# Patient Record
Sex: Male | Born: 1985 | Hispanic: No | Marital: Single | Smoking: Current every day smoker
Health system: Southern US, Community
[De-identification: ages and names within clinical notes are randomized; demographics above are authoritative.]

---

## 2015-02-09 ENCOUNTER — Encounter (HOSPITAL_COMMUNITY): Payer: Self-pay | Admitting: Emergency Medicine

## 2015-02-09 ENCOUNTER — Emergency Department (INDEPENDENT_AMBULATORY_CARE_PROVIDER_SITE_OTHER)
Admission: EM | Admit: 2015-02-09 | Discharge: 2015-02-09 | Disposition: A | Discharge status: Home / Self Care | Payer: PRIVATE HEALTH INSURANCE | Source: Home / Self Care | Attending: Family Medicine | Admitting: Family Medicine

## 2015-02-09 DIAGNOSIS — M545 Low back pain, unspecified: Secondary | ICD-10-CM

## 2015-02-09 DIAGNOSIS — S76311A Strain of muscle, fascia and tendon of the posterior muscle group at thigh level, right thigh, initial encounter: Secondary | ICD-10-CM | POA: Diagnosis not present

## 2015-02-09 DIAGNOSIS — M791 Myalgia, unspecified site: Secondary | ICD-10-CM

## 2015-02-09 DIAGNOSIS — T148 Other injury of unspecified body region: Secondary | ICD-10-CM

## 2015-02-09 DIAGNOSIS — T148XXA Other injury of unspecified body region, initial encounter: Secondary | ICD-10-CM

## 2015-02-09 MED ORDER — DICLOFENAC POTASSIUM 50 MG PO TABS: 50.0000 mg | ORAL_TABLET | Freq: Three times a day (TID) | ORAL | Status: AC

## 2015-02-09 NOTE — Discharge Instructions (Signed)
Back Pain, Adult Back pain is very common. The pain often gets better over time. The cause of back pain is usually not dangerous. Most people can learn to manage their back pain on their own.  HOME CARE  Watch your back pain for any changes. The following actions may help to lessen any pain you are feeling:  Stay active. Start with short walks on flat ground if you can. Try to walk farther each day.  Exercise regularly as told by your doctor. Exercise helps your back heal faster. It also helps avoid future injury by keeping your muscles strong and flexible.  Do not sit, drive, or stand in one place for more than 30 minutes.  Do not stay in bed. Resting more than 1-2 days can slow down your recovery.  Be careful when you bend or lift an object. Use good form when lifting:  Bend at your knees.  Keep the object close to your body.  Do not twist.  Sleep on a firm mattress. Lie on your side, and bend your knees. If you lie on your back, put a pillow under your knees.  Take medicines only as told by your doctor.  Put ice on the injured area.  Put ice in a plastic bag.  Place a towel between your skin and the bag.  Leave the ice on for 20 minutes, 2-3 times a day for the first 2-3 days. After that, you can switch between ice and heat packs.  Avoid feeling anxious or stressed. Find good ways to deal with stress, such as exercise.  Maintain a healthy weight. Extra weight puts stress on your back. GET HELP IF:   You have pain that does not go away with rest or medicine.  You have worsening pain that goes down into your legs or buttocks.  You have pain that does not get better in one week.  You have pain at night.  You lose weight.  You have a fever or chills. GET HELP RIGHT AWAY IF:   You cannot control when you poop (bowel movement) or pee (urinate).  Your arms or legs feel weak.  Your arms or legs lose feeling (numbness).  You feel sick to your stomach (nauseous) or  throw up (vomit).  You have belly (abdominal) pain.  You feel like you may pass out (faint).   This information is not intended to replace advice given to you by your health care provider. Make sure you discuss any questions you have with your health care provider.   Document Released: 08/24/2007 Document Revised: 03/28/2014 Document Reviewed: 07/09/2013 Elsevier Interactive Patient Education 2016 [place] Injury Prevention Back injuries can be very painful. They can also be difficult to heal. After having one back injury, you are more likely to injure your back again. It is important to learn how to avoid injuring or re-injuring your back. The following tips can help you to prevent a back injury. WHAT SHOULD I KNOW ABOUT PHYSICAL FITNESS?  Exercise for 30 minutes per day on most days of the week or as told by your doctor. Make sure to:  Do aerobic exercises, such as walking, jogging, biking, or swimming.  Do exercises that increase balance and strength, such as tai chi and yoga.  Do stretching exercises. This helps with flexibility.  Try to develop strong belly (abdominal) muscles. Your belly muscles help to support your back.  Stay at a healthy weight. This helps to decrease your risk of a back injury.  WHAT SHOULD I KNOW ABOUT MY DIET?  Talk with your doctor about your overall diet. Take supplements and vitamins only as told by your doctor.  Talk with your doctor about how much calcium and vitamin D you need each day. These nutrients help to prevent weakening of the bones (osteoporosis).  Include good sources of calcium in your diet, such as:  Dairy products.  Green leafy vegetables.  Products that have had calcium added to them (fortified).  Include good sources of vitamin D in your diet, such as:  Milk.  Foods that have had vitamin D added to them. WHAT SHOULD I KNOW ABOUT MY POSTURE?  Sit up straight and stand up straight. Avoid leaning forward when you  sit or hunching over when you stand.  Choose chairs that have good low-back (lumbar) support.  If you work at a desk, sit close to it so you do not need to lean over. Keep your chin tucked in. Keep your neck drawn back. Keep your elbows bent so your arms look like the letter "L" (right angle).  Sit high and close to the steering wheel when you drive. Add a low-back support to your car seat, if needed.  Avoid sitting or standing in one position for very long. Take breaks to get up, stretch, and walk around at least one time every hour. Take breaks every hour if you are driving for long periods of time.  Sleep on your side with your knees slightly bent, or sleep on your back with a pillow under your knees. Do not lie on the front of your body to sleep. WHAT SHOULD I KNOW ABOUT LIFTING, TWISTING, AND REACHING Lifting and Heavy Lifting  Avoid heavy lifting, especially lifting over and over again. If you must do heavy lifting:  Stretch before lifting.  Work slowly.  Rest between lifts.  Use a tool such as a cart or a dolly to move objects if one is available.  Make several small trips instead of carrying one heavy load.  Ask for help when you need it, especially when moving big objects.  Follow these steps when lifting:  Stand with your feet shoulder-width apart.  Get as close to the object as you can. Do not pick up a heavy object that is far from your body.  Use handles or lifting straps if they are available.  Bend at your knees. Squat down, but keep your heels off the floor.  Keep your shoulders back. Keep your chin tucked in. Keep your back straight.  Lift the object slowly while you tighten the muscles in your legs, belly, and butt. Keep the object as close to the center of your body as possible.  Follow these steps when putting down a heavy load:  Stand with your feet shoulder-width apart.  Lower the object slowly while you tighten the muscles in your legs, belly, and  butt. Keep the object as close to the center of your body as possible.  Keep your shoulders back. Keep your chin tucked in. Keep your back straight.  Bend at your knees. Squat down, but keep your heels off the floor.  Use handles or lifting straps if they are available. Twisting and Reaching  Avoid lifting heavy objects above your waist.  Do not twist at your waist while you are lifting or carrying a load. If you need to turn, move your feet.  Do not bend over without bending at your knees.  Avoid reaching over your head, across a table,  or for an object on a high surface.  WHAT ARE SOME OTHER TIPS?  Avoid wet floors and icy ground. Keep sidewalks clear of ice to prevent falls.   Do not sleep on a mattress that is too soft or too hard.   Keep items that you use often within easy reach.   Put heavier objects on shelves at waist level, and put lighter objects on lower or higher shelves.  Find ways to lower your stress, such as:  Exercise.  Massage.  Relaxation techniques.  Talk with your doctor if you feel anxious or depressed. These conditions can make back pain worse.  Wear flat heel shoes with cushioned soles.  Avoid making quick (sudden) movements.  Use both shoulder straps when carrying a backpack.  Do not use any tobacco products, including cigarettes, chewing tobacco, or electronic cigarettes. If you need help quitting, ask your doctor.   This information is not intended to replace advice given to you by your health care provider. Make sure you discuss any questions you have with your health care provider.   Document Released: 08/24/2007 Document Revised: 07/22/2014 Document Reviewed: 03/11/2014 Elsevier Interactive Patient Education 2016 [place].  Back Exercises If you have pain in your back, do these exercises 2-3 times each day or as told by your doctor. When the pain goes away, do the exercises once each day, but repeat the steps more times for  each exercise (do more repetitions). If you do not have pain in your back, do these exercises once each day or as told by your doctor. EXERCISES Single Knee to Chest Do these steps 3-5 times in a row for each leg:  Lie on your back on a firm bed or the floor with your legs stretched out.  Bring one knee to your chest.  Hold your knee to your chest by grabbing your knee or thigh.  Pull on your knee until you feel a gentle stretch in your lower back.  Keep doing the stretch for 10-30 seconds.  Slowly let go of your leg and straighten it. Pelvic Tilt Do these steps 5-10 times in a row:  Lie on your back on a firm bed or the floor with your legs stretched out.  Bend your knees so they point up to the ceiling. Your feet should be flat on the floor.  Tighten your lower belly (abdomen) muscles to press your lower back against the floor. This will make your tailbone point up to the ceiling instead of pointing down to your feet or the floor.  Stay in this position for 5-10 seconds while you gently tighten your muscles and breathe evenly. Cat-Cow Do these steps until your lower back bends more easily:  Get on your hands and knees on a firm surface. Keep your hands under your shoulders, and keep your knees under your hips. You may put padding under your knees.  Let your head hang down, and make your tailbone point down to the floor so your lower back is round like the back of a cat.  Stay in this position for 5 seconds.  Slowly lift your head and make your tailbone point up to the ceiling so your back hangs low (sags) like the back of a cow.  Stay in this position for 5 seconds. Press-Ups Do these steps 5-10 times in a row:  Lie on your belly (face-down) on the floor.  Place your hands near your head, about shoulder-width apart.  While you keep your back relaxed  and keep your hips on the floor, slowly straighten your arms to raise the top half of your body and lift your shoulders.  Do not use your back muscles. To make yourself more comfortable, you may change where you place your hands.  Stay in this position for 5 seconds.  Slowly return to lying flat on the floor. Bridges Do these steps 10 times in a row:  Lie on your back on a firm surface.  Bend your knees so they point up to the ceiling. Your feet should be flat on the floor.  Tighten your butt muscles and lift your butt off of the floor until your waist is almost as high as your knees. If you do not feel the muscles working in your butt and the back of your thighs, slide your feet 1-2 inches farther away from your butt.  Stay in this position for 3-5 seconds.  Slowly lower your butt to the floor, and let your butt muscles relax. If this exercise is too easy, try doing it with your arms crossed over your chest. Belly Crunches Do these steps 5-10 times in a row:  Lie on your back on a firm bed or the floor with your legs stretched out.  Bend your knees so they point up to the ceiling. Your feet should be flat on the floor.  Cross your arms over your chest.  Tip your chin a little bit toward your chest but do not bend your neck.  Tighten your belly muscles and slowly raise your chest just enough to lift your shoulder blades a tiny bit off of the floor.  Slowly lower your chest and your head to the floor. Back Lifts Do these steps 5-10 times in a row:  Lie on your belly (face-down) with your arms at your sides, and rest your forehead on the floor.  Tighten the muscles in your legs and your butt.  Slowly lift your chest off of the floor while you keep your hips on the floor. Keep the back of your head in line with the curve in your back. Look at the floor while you do this.  Stay in this position for 3-5 seconds.  Slowly lower your chest and your face to the floor. GET HELP IF:  Your back pain gets a lot worse when you do an exercise.  Your back pain does not lessen 2 hours after you  exercise. If you have any of these problems, stop doing the exercises. Do not do them again unless your doctor says it is okay. GET HELP RIGHT AWAY IF:  You have sudden, very bad back pain. If this happens, stop doing the exercises. Do not do them again unless your doctor says it is okay.   This information is not intended to replace advice given to you by your health care provider. Make sure you discuss any questions you have with your health care provider.   Document Released: 04/09/2010 Document Revised: 11/26/2014 Document Reviewed: 05/01/2014 Elsevier Interactive Patient Education 2016 [place].  Hamstring Strain With Rehab The hamstring muscle and tendons are vulnerable to muscle or tendon tear (strain). Hamstring tears cause pain and inflammation in the backside of the thigh, where the hamstring muscles are located. The hamstring is comprised of three muscles that are responsible for straightening the hip, bending the knee, and stabilizing the knee. These muscles are important for walking, running, and jumping. Hamstring strain is the most common injury of the thigh. Hamstring strains are classified as  grade 1 or 2 strains. Grade 1 strains cause pain, but the tendon is not lengthened. Grade 2 strains include a lengthened ligament due to the ligament being stretched or partially ruptured. With grade 2 strains there is still function, although the function may be decreased.  SYMPTOMS   Pain, tenderness, swelling, warmth, or redness over the hamstring muscles, at the back of the thigh.  Pain that gets worse during and after intense activity.  A "pop" heard in the area, at the time of injury.  Muscle spasm in the hamstring muscles.  Pain or weakness with running, jumping, or bending the knee against resistance.  Crackling sound (crepitation) when the tendon is moved or touched.  Bruising (contusion) in the thigh within 48 hours of injury.  Loss of fullness of the muscle, or area  of muscle bulging in the case of a complete rupture. CAUSES  A muscle strain occurs when a force is placed on the muscle or tendon that is greater than it can withstand. Common causes of injury include:  Strain from overuse or sudden increase in the frequency, duration, or intensity of activity.  Single violent blow or force to the back of the knee or the hamstring area of the thigh. RISK INCREASES WITH:  Sports that require quick starts (sprinting, racquetball, tennis).  Sports that require jumping (basketball, volleyball).  Kicking sports and water skiing.  Contact sports (soccer, football).  Poor strength and flexibility.  Failure to warm up properly before activity.  Previous thigh, knee, or pelvis injury.  Poor exercise technique.  Poor posture. PREVENTION  Maintain physical fitness:  Strength, flexibility, and endurance.  Cardiovascular fitness.  Learn and use proper exercise technique and posture.  Wear proper fitted and padded protective equipment. PROGNOSIS  If treated properly, hamstring strains are usually curable in 2 to 6 weeks. RELATED COMPLICATIONS   Longer healing time, if not properly treated or if not given adequate time to heal.  Chronically inflamed tendon, causing persistent pain with activity that may progress to constant pain.  Recurring symptoms, if activity is resumed too soon.  Vulnerable to repeated injury (in up to 25% of cases). TREATMENT  Treatment first involves the use of ice and medication to help reduce pain and inflammation. It is also important to complete strengthening and stretching exercises, as well as modifying any activities that aggravate the symptoms. These exercises may be completed at home or with a therapist. Your caregiver may recommend the use of crutches to help reduce pain and discomfort, especially is the strain is severe enough to cause limping. If the tendon has pulled away from the bone, then surgery is necessary  to reattach it. MEDICATION   If pain medicine is needed, nonsteroidal anti-inflammatory medicines (aspirin and ibuprofen), or other minor pain relievers (acetaminophen), are often advised.  Do not take pain medicine for 7 days before surgery.  Prescription pain relievers may be given if your caregiver thinks they are needed. Use only as directed and only as much as you need.  Corticosteroid injections may be recommended. However, these injections should only be used for serious cases, as they can only be given a certain number of times.  Ointments applied to the skin may be beneficial. HEAT AND COLD  Cold treatment (icing) relieves pain and reduces inflammation. Cold treatment should be applied for 10 to 15 minutes every 2 to 3 hours, and immediately after activity that aggravates your symptoms. Use ice packs or an ice massage.  Heat treatment may be used  before performing the stretching and strengthening activities prescribed by your caregiver, physical therapist, or athletic trainer. Use a heat pack or a warm water soak. SEEK MEDICAL CARE IF:   Symptoms get worse or do not improve in 2 weeks, despite treatment.  New, unexplained symptoms develop. (Drugs used in treatment may produce side effects.) EXERCISES RANGE OF MOTION (ROM) AND STRETCHING EXERCISES - Hamstring Strain These exercises may help you when beginning to rehabilitate your injury. Your symptoms may go away with or without further involvement from your physician, physical therapist or athletic trainer. While completing these exercises, remember:   Restoring tissue flexibility helps normal motion to return to the joints. This allows healthier, less painful movement and activity.  An effective stretch should be held for at least 30 seconds.  A stretch should never be painful. You should only feel a gentle lengthening or release in the stretched tissue. STRETCH - Hamstrings, Standing  Stand or sit, and extend your right /  left leg, placing your foot on a chair or foot stool.  Keep a slight arch in your low back and your hips straight forward.  Lead with your chest, and lean forward at the waist until you feel a gentle stretch in the back of your right / left knee or thigh. (When done correctly, this exercise requires leaning only a small distance.)  Hold this position for __________ seconds. Repeat __________ times. Complete this stretch __________ times per day. STRETCH - Hamstrings, Supine   Lie on your back. Loop a belt or towel over the ball of your right / left foot.  Straighten your right / left knee and slowly pull on the belt to raise your leg. Do not allow the right / left knee to bend. Keep your opposite leg flat on the floor.  Raise the leg until you feel a gentle stretch behind your right / left knee or thigh. Hold this position for __________ seconds. Repeat __________ times. Complete this stretch __________ times per day.  STRETCH - Hamstrings, Doorway  Lie on your back with your right / left leg extended and resting on the wall, and the opposite leg flat on the ground through the door. Initially, position your bottom farther away from the wall.  Keep your right / left knee straight. If you feel a stretch behind your knee or thigh, hold this position for __________ seconds.  If you do not feel a stretch, scoot your bottom closer to the door and hold __________ seconds. Repeat __________ times. Complete this stretch __________ times per day.  STRETCH - Hamstrings/Adductors, V-Sit   Sit on the floor with your legs extended in a large "V," keeping your knees straight.  With your head and chest upright, bend at your waist reaching for your left foot to stretch your right thigh muscles.  You should feel a stretch in your right inner thigh. Hold for __________ seconds.  Return to the upright position to relax your leg muscles.  Continuing to keep your chest upright, bend straight forward at  your waist to stretch your hamstrings.  You should feel a stretch behind both of your thighs and knees. Hold for __________ seconds.  Return to the upright position to relax your leg muscles.  With your head and chest upright, bend at your waist reaching for your right foot to stretch your left thigh muscles.  You should feel a stretch in your left inner thigh. Hold for __________ seconds.  Return to the upright position to relax your  leg muscles. Repeat __________ times. Complete this exercise __________ times per day.  STRENGTHENING EXERCISES - Hamstring Strain These exercises may help you when beginning to rehabilitate your injury. They may resolve your symptoms with or without further involvement from your physician, physical therapist or athletic trainer. While completing these exercises, remember:   Muscles can gain both the endurance and the strength needed for everyday activities through controlled exercises.  Complete these exercises as instructed by your physician, physical therapist or athletic trainer. Increase the resistance and repetitions only as guided.  You may experience muscle soreness or fatigue, but the pain or discomfort you are trying to eliminate should never get worse during these exercises. If this pain does get worse, stop and make certain you are following the directions exactly. If the pain is still present after adjustments, discontinue the exercise until you can discuss the trouble with your clinician. STRENGTH - Hip Extensors, Straight Leg Raises   Lie on your stomach on a firm surface.  Tense the muscles in your buttocks to lift your right / left leg about 4 inches. If you cannot lift your leg this high without arching your back, place a pillow under your hips.  Keep your knee straight. Hold for __________ seconds.  Slowly lower your leg to the starting position and allow it to relax completely before starting the next repetition. Repeat __________ times.  Complete this exercise __________ times per day.  STRENGTH - Hamstring, Isometrics   Lie on your back on a firm surface.  Bend your right / left knee approximately __________ degrees.  Dig your heel into the surface, as if you are trying to pull it toward your buttocks. Tighten the muscles in the back of your thighs to "dig" as hard as you can, without increasing any pain.  Hold this position for __________ seconds.  Release the tension gradually and allow your muscles to completely relax for __________ seconds between each exercise. Repeat __________ times. Complete this exercise __________ times per day.  STRENGTH - Hamstring, Curls   Lay on your stomach with your legs extended. (If you lay on a bed, your feet may hang over the edge.)  Tighten the muscles in the back of your thigh to bend your right / left knee up to 90 degrees. Keep your hips flat on the bed or floor.  Hold this position for __________ seconds.  Slowly lower your leg back to the starting position. Repeat __________ times. Complete this exercise __________ times per day.  OPTIONAL ANKLE WEIGHTS: Begin with ____________________, but DO NOT exceed ____________________. Increase in 1 pound/0.5 kilogram increments.   This information is not intended to replace advice given to you by your health care provider. Make sure you discuss any questions you have with your health care provider.   Document Released: 03/07/2005 Document Revised: 03/28/2014 Document Reviewed: 06/19/2008 Elsevier Interactive Patient Education 2016 [place] therapy can help ease sore, stiff, injured, and tight muscles and joints. Heat relaxes your muscles, which may help ease your pain.  RISKS AND COMPLICATIONS If you have any of the following conditions, do not use heat therapy unless your health care provider has approved:  Poor circulation.  Healing wounds or scarred skin in the area being treated.  Diabetes, heart  disease, or high blood pressure.  Not being able to feel (numbness) the area being treated.  Unusual swelling of the area being treated.  Active infections.  Blood clots.  Cancer.  Inability to communicate pain.  This may include young children and people who have problems with their brain function (dementia).  Pregnancy. Heat therapy should only be used on old, pre-existing, or long-lasting (chronic) injuries. Do not use heat therapy on new injuries unless directed by your health care provider. HOW TO USE HEAT THERAPY There are several different kinds of heat therapy, including:  Moist heat pack.  Warm water bath.  Hot water bottle.  Electric heating pad.  Heated gel pack.  Heated wrap.  Electric heating pad. Use the heat therapy method suggested by your health care provider. Follow your health care provider's instructions on when and how to use heat therapy. GENERAL HEAT THERAPY RECOMMENDATIONS  Do not sleep while using heat therapy. Only use heat therapy while you are awake.  Your skin may turn pink while using heat therapy. Do not use heat therapy if your skin turns red.  Do not use heat therapy if you have new pain.  High heat or long exposure to heat can cause burns. Be careful when using heat therapy to avoid burning your skin.  Do not use heat therapy on areas of your skin that are already irritated, such as with a rash or sunburn. SEEK MEDICAL CARE IF:  You have blisters, redness, swelling, or numbness.  You have new pain.  Your pain is worse. MAKE SURE YOU:  Understand these instructions.  Will watch your condition.  Will get help right away if you are not doing well or get worse.   This information is not intended to replace advice given to you by your health care provider. Make sure you discuss any questions you have with your health care provider.   Document Released: 05/30/2011 Document Revised: 03/28/2014 Document Reviewed:  04/30/2013 Elsevier Interactive Patient Education 2016 [place].  Muscle Strain A muscle strain (pulled muscle) happens when a muscle is stretched beyond normal length. It happens when a sudden, violent force stretches your muscle too far. Usually, a few of the fibers in your muscle are torn. Muscle strain is common in athletes. Recovery usually takes 1-2 weeks. Complete healing takes 5-6 weeks.  HOME CARE   Follow the PRICE method of treatment to help your injury get better. Do this the first 2-3 days after the injury:  Protect. Protect the muscle to keep it from getting injured again.  Rest. Limit your activity and rest the injured body part.  Ice. Put ice in a plastic bag. Place a towel between your skin and the bag. Then, apply the ice and leave it on from 15-20 minutes each hour. After the third day, switch to moist heat packs.  Compression. Use a splint or elastic bandage on the injured area for comfort. Do not put it on too tightly.  Elevate. Keep the injured body part above the level of your heart.  Only take medicine as told by your doctor.  Warm up before doing exercise to prevent future muscle strains. GET HELP IF:   You have more pain or puffiness (swelling) in the injured area.  You feel numbness, tingling, or notice a loss of strength in the injured area. MAKE SURE YOU:   Understand these instructions.  Will watch your condition.  Will get help right away if you are not doing well or get worse.   This information is not intended to replace advice given to you by your health care provider. Make sure you discuss any questions you have with your health care provider.   Document Released: 12/15/2007 Document Revised:  12/26/2012 Document Reviewed: 10/04/2012 Elsevier Interactive Patient Education [employer].

## 2015-02-09 NOTE — ED Provider Notes (Signed)
CSN: [id number]     Arrival date & time 02/09/15  1526 History   First MD Initiated Contact with Patient 02/09/15 1602     Chief Complaint  Patient presents with  . Back Pain   (Consider location/radiation/quality/duration/timing/severity/associated sxs/prior Treatment) HPI Comments: 29 year old male complaining of pain in his feet and his lower back. He states it occurred approximately 1 week ago. He initially states that nothing made it worse however asked him to perform certain maneuvers and movements this elicited and exacerbated the pain. 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 He also complains of pain to the right hamstring muscle and quadriceps. He denies focal weakness. Denies radiation of pain. Denies paresthesias. Denies any single event that maybe caused pain. Onset was relatively gradual. He said his back pain is worse upon waking in the morning.  Patient is a 29 y.o. male presenting with back pain.  Back Pain Associated symptoms: no chest pain, no headaches, no numbness and no weakness     History reviewed. No pertinent past medical history. No past surgical history on file. No family history on file. Social History  Substance Use Topics  . Smoking status: Current Every Day Smoker    Types: Cigarettes  . Smokeless tobacco: None  . Alcohol Use: No    Review of Systems  Constitutional: Positive for activity change.  Respiratory: Negative.   Cardiovascular: Negative for chest pain.  Gastrointestinal: Negative.   Genitourinary: Negative.   Musculoskeletal: Positive for myalgias and back pain. Negative for joint swelling and neck pain.       As per HPI  Skin: Negative.   Neurological: Negative for dizziness, weakness, numbness and headaches.    Allergies  Review of patient's allergies indicates no known allergies.  Home Medications   Prior to Admission medications   Medication Sig Start Date End Date Taking? Authorizing Provider   diclofenac (CATAFLAM) 50 MG tablet Take 1 tablet (50 mg total) by mouth 3 (three) times daily. One tablet TID with food prn pain. 02/09/15   Hayden Rasmussenavid Lakenya Riendeau, NP   Meds Ordered and Administered this Visit  Medications - No data to display  BP 127/64 mmHg  Pulse 54  Temp(Src) 98.5 F (36.9 C) (Oral)  Resp 16  SpO2 98% No data found.   Physical Exam  Constitutional: He appears well-developed and well-nourished. No distress.  Neck: Normal range of motion. Neck supple.  Pulmonary/Chest: Effort normal. No respiratory distress.  Musculoskeletal: He exhibits tenderness. He exhibits no edema.  Tenderness to light palpation is moderate in causes the patient to withdrawal. There is tenderness across the lower para lumbar musculature. There is also tenderness to the right hamstring and lateral quadriceps musculature. Patient is able to bend forward approximately 30, limited due to pain. Patient is able to squat and rise to a standing position without assistance. Distal neurovascular motor Sentry is intact.  Neurological: He is alert. He exhibits normal muscle tone. Coordination normal.  Skin: Skin is warm and dry.  Psychiatric: He has a normal mood and affect.  Nursing note and vitals reviewed.   ED Course  Procedures (including critical care time)  Labs Review Labs Reviewed - No data to display  Imaging Review No results found.   Visual Acuity Review  Right Eye Distance:   Left Eye Distance:   Bilateral Distance:    Right Eye Near:   Left Eye Near:    Bilateral Near:  MDM   1. Myalgia   2. Muscle strain   3. Bilateral low back pain without sciatica   4. Hamstring muscle strain, right, initial encounter     Limit activity for the next couple days. Your work that includes driving a forklift is causing pain in the muscles of your right leg and back. Take Cataflam for pain as needed. Apply heat to your back and sore muscles. Perform stretches. Return instructions  regarding stretches and exercises.  Hayden Rasmussen, NP 02/09/15 1721

## 2015-02-09 NOTE — ED Notes (Signed)
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

## 2020-03-21 ENCOUNTER — Emergency Department (HOSPITAL_COMMUNITY)
Admission: EM | Admit: 2020-03-21 | Discharge: 2020-03-21 | Disposition: A | Discharge status: Home / Self Care | Payer: PRIVATE HEALTH INSURANCE | Attending: Emergency Medicine | Admitting: Emergency Medicine

## 2020-03-21 ENCOUNTER — Emergency Department (HOSPITAL_COMMUNITY)
Admission: EM | Admit: 2020-03-21 | Discharge: 2020-03-21 | Disposition: A | Discharge status: Home / Self Care | Payer: PRIVATE HEALTH INSURANCE | Source: Home / Self Care | Attending: Emergency Medicine | Admitting: Emergency Medicine

## 2020-03-21 ENCOUNTER — Emergency Department (HOSPITAL_COMMUNITY): Payer: PRIVATE HEALTH INSURANCE

## 2020-03-21 ENCOUNTER — Encounter (HOSPITAL_COMMUNITY): Payer: Self-pay | Admitting: Emergency Medicine

## 2020-03-21 ENCOUNTER — Other Ambulatory Visit: Payer: Self-pay

## 2020-03-21 DIAGNOSIS — U071 COVID-19: Secondary | ICD-10-CM | POA: Insufficient documentation

## 2020-03-21 DIAGNOSIS — F17210 Nicotine dependence, cigarettes, uncomplicated: Secondary | ICD-10-CM | POA: Insufficient documentation

## 2020-03-21 DIAGNOSIS — R109 Unspecified abdominal pain: Secondary | ICD-10-CM

## 2020-03-21 DIAGNOSIS — R1032 Left lower quadrant pain: Secondary | ICD-10-CM | POA: Insufficient documentation

## 2020-03-21 DIAGNOSIS — Z5321 Procedure and treatment not carried out due to patient leaving prior to being seen by health care provider: Secondary | ICD-10-CM | POA: Insufficient documentation

## 2020-03-21 DIAGNOSIS — R197 Diarrhea, unspecified: Secondary | ICD-10-CM | POA: Insufficient documentation

## 2020-03-21 LAB — COMPREHENSIVE METABOLIC PANEL
ALT: 23 U/L (ref 0–44)
AST: 24 U/L (ref 15–41)
Albumin: 4.1 g/dL (ref 3.5–5.0)
Alkaline Phosphatase: 68 U/L (ref 38–126)
Anion gap: 13 (ref 5–15)
BUN: 20 mg/dL (ref 6–20)
CO2: 20 mmol/L — ABNORMAL LOW (ref 22–32)
Calcium: 9 mg/dL (ref 8.9–10.3)
Chloride: 103 mmol/L (ref 98–111)
Creatinine, Ser: 0.96 mg/dL (ref 0.61–1.24)
GFR, Estimated: >60 mL/min (ref >60)
Glucose, Bld: 128 mg/dL — ABNORMAL HIGH (ref 70–99)
Potassium: 4 mmol/L (ref 3.5–5.1)
Sodium: 136 mmol/L (ref 135–145)
Total Bilirubin: 0.4 mg/dL (ref 0.3–1.2)
Total Protein: 7 g/dL (ref 6.5–8.1)

## 2020-03-21 LAB — URINALYSIS, MICROSCOPIC (REFLEX)
Squamous Epithelial / LPF: NONE SEEN (ref 0–5)
WBC, UA: NONE SEEN WBC/hpf (ref 0–5)

## 2020-03-21 LAB — URINALYSIS, ROUTINE W REFLEX MICROSCOPIC
Bilirubin Urine: NEGATIVE
Glucose, UA: NEGATIVE mg/dL
Ketones, ur: 15 mg/dL — AB
Leukocytes,Ua: NEGATIVE
Nitrite: NEGATIVE
Protein, ur: NEGATIVE mg/dL
Specific Gravity, Urine: >1.03 — ABNORMAL HIGH (ref 1.005–1.030)
pH: 5 (ref 5.0–8.0)

## 2020-03-21 LAB — LIPASE, BLOOD: Lipase: 23 U/L (ref 11–51)

## 2020-03-21 LAB — CBC
HCT: 42.4 % (ref 39.0–52.0)
Hemoglobin: 14.2 g/dL (ref 13.0–17.0)
MCH: 27.6 pg (ref 26.0–34.0)
MCHC: 33.5 g/dL (ref 30.0–36.0)
MCV: 82.5 fL (ref 80.0–100.0)
Platelets: 130 10*3/uL — ABNORMAL LOW (ref 150–400)
RBC: 5.14 MIL/uL (ref 4.22–5.81)
RDW: 12 % (ref 11.5–15.5)
WBC: 7.2 10*3/uL (ref 4.0–10.5)
nRBC: 0 % (ref 0.0–0.2)

## 2020-03-21 LAB — RESP PANEL BY RT-PCR (FLU A&B, COVID) ARPGX2
Influenza A by PCR: NEGATIVE
Influenza B by PCR: NEGATIVE
SARS Coronavirus 2 by RT PCR: POSITIVE — AB

## 2020-03-21 MED ORDER — CEPHALEXIN 500 MG PO CAPS: 500.0000 mg | ORAL_CAPSULE | Freq: Three times a day (TID) | ORAL | 0 refills | Status: AC

## 2020-03-21 MED ORDER — IBUPROFEN 800 MG PO TABS
800.0000 mg | ORAL_TABLET | Freq: Once | ORAL | Status: AC
  Administered 2020-03-21: 800 mg via ORAL
  Filled 2020-03-21: qty 1

## 2020-03-21 MED ORDER — KETOROLAC TROMETHAMINE 15 MG/ML IJ SOLN
15.0000 mg | Freq: Once | INTRAMUSCULAR | Status: AC
  Administered 2020-03-21: 15 mg via INTRAVENOUS
  Filled 2020-03-21: qty 1

## 2020-03-21 NOTE — ED Triage Notes (Signed)
Pt reports weakness and left side abd pain since midnight. Pt seen earlier today but LWBS. Labs and UA done this AM. States his pain is better since getting pain meds earlier today.

## 2020-03-21 NOTE — ED Triage Notes (Signed)
Pt reports LLQ abdominal pain, beginning around midnight tonight. Denies recent fever, vomiting, is having diarrhea. Rating pain 8/10.

## 2020-03-21 NOTE — Discharge Instructions (Addendum)
Call your primary care doctor or specialist as discussed in the next 2-3 days.   Return immediately back to the ER if:  Your symptoms worsen within the next 12-24 hours. You develop new symptoms such as new fevers, persistent vomiting, new pain, shortness of breath, or new weakness or numbness, or if you have any other concerns.  

## 2020-03-21 NOTE — ED Provider Notes (Signed)
[facility] EMERGENCY DEPARTMENT Provider Note   CSN: [id number] Arrival date & time: 03/21/20  1201     History Chief Complaint  Patient presents with  . Weakness    Vincent Farrell is a 35 y.o. male.  Patient presenting with chief complaint of left-sided flank pain.  Pain started last night and been persistent.  Also complaining of generalized fatigue and weakness.  He states symptoms been ongoing since last night.  Nothing seems to make it better.  Denies fevers or cough or vomiting or diarrhea.  He was seen in the ER last night but left without being seen but had labs and urinalysis done prior to leaving.        History reviewed. No pertinent past medical history.  There are no problems to display for this patient.   History reviewed. No pertinent surgical history.     No family history on file.  Social History   Tobacco Use  . Smoking status: Current Every Day Smoker    Types: Cigarettes  Substance Use Topics  . Alcohol use: No    Home Medications Prior to Admission medications   Medication Sig Start Date End Date Taking? Authorizing Provider  cephALEXin (KEFLEX) 500 MG capsule Take 1 capsule (500 mg total) by mouth 3 (three) times daily for 7 days. 03/21/20 03/28/20 Yes Zyan Mirkin, Eustace Moore, MD  diclofenac (CATAFLAM) 50 MG tablet Take 1 tablet (50 mg total) by mouth 3 (three) times daily. One tablet TID with food prn pain. 02/09/15   Hayden Rasmussen, NP    Allergies    Patient has no known allergies.  Review of Systems   Review of Systems  Constitutional: Negative for fever.  HENT: Negative for ear pain and sore throat.   Eyes: Negative for pain.  Respiratory: Negative for cough.   Cardiovascular: Negative for chest pain.  Gastrointestinal: Negative for abdominal pain.  Genitourinary: Positive for flank pain.  Musculoskeletal: Negative for back pain.  Skin: Negative for color change and rash.  Neurological: Negative for syncope.  All  other systems reviewed and are negative.   Physical Exam Updated Vital Signs BP 123/76   Pulse 75   Temp 98.8 F (37.1 C) (Oral)   Resp (!) 21   SpO2 99%   Physical Exam Constitutional:      General: He is not in acute distress.    Appearance: He is well-developed.  HENT:     Head: Normocephalic.     Nose: Nose normal.  Eyes:     Extraocular Movements: Extraocular movements intact.  Cardiovascular:     Rate and Rhythm: Normal rate.  Pulmonary:     Effort: Pulmonary effort is normal.  Skin:    Coloration: Skin is not jaundiced.  Neurological:     General: No focal deficit present.     Mental Status: He is alert and oriented to person, place, and time. Mental status is at baseline.     Cranial Nerves: No cranial nerve deficit.     Motor: No weakness.     Gait: Gait normal.     Comments: 5/5 strength all extremities.     ED Results / Procedures / Treatments   Labs (all labs ordered are listed, but only abnormal results are displayed) Labs Reviewed  RESP PANEL BY RT-PCR (FLU A&B, COVID) [id number]  URINALYSIS, ROUTINE W REFLEX MICROSCOPIC    EKG None  Radiology CT Renal Stone Study  Result Date: 03/21/2020 CLINICAL DATA:  Left lower quadrant pain  EXAM: CT ABDOMEN AND PELVIS WITHOUT CONTRAST TECHNIQUE: Multidetector CT imaging of the abdomen and pelvis was performed following the standard protocol without IV contrast. COMPARISON:  None. FINDINGS: Lower chest: 4 mm right lower lobe pulmonary nodule on image 6. Right basilar atelectasis. No effusions. Hepatobiliary: No focal hepatic abnormality. Gallbladder unremarkable. Pancreas: No focal abnormality or ductal dilatation. Spleen: No focal abnormality.  Normal size. Adrenals/Urinary Tract: No renal or ureteral stones. Slight fullness of the left renal pelvis and proximal ureter. Slight perinephric stranding around the left kidney. This could be related to recently passed stone. Adrenal glands and urinary bladder unremarkable.  Stomach/Bowel: Normal appendix. Stomach, large and small bowel grossly unremarkable. Vascular/Lymphatic: No evidence of aneurysm or adenopathy. Reproductive: No visible focal abnormality. Other: No free fluid or free air. Musculoskeletal: No acute bony abnormality. IMPRESSION: Slight fullness of the left renal collecting system and proximal ureter with slight left perinephric stranding. No visible stones. This could be related to recently passed stone. Electronically Signed   By: Charlett Nose M.D.   On: 03/21/2020 17:03    Procedures Procedures (including critical care time)  Medications Ordered in ED Medications  ketorolac (TORADOL) 15 MG/ML injection 15 mg (15 mg Intravenous Given 03/21/20 1730)    ED Course  I have reviewed the triage vital signs and the nursing notes.  Pertinent labs & imaging results that were available during my care of the patient were reviewed by me and considered in my medical decision making (see chart for details).    MDM Rules/Calculators/A&P                          Review of labs urinalysis from yesterday were unremarkable white count normal urinalysis shows only rare bacteria.  CT imaging today shows some mild hydronephrosis on the left side with finding consistent with recently passed stone perhaps.  Patient given Toradol with improvement of symptoms.  Will be given a prescription of pain medication, advised follow-up with urology within the week.  Advised immediate return for fevers worsening symptoms or any additional concerns.   Final Clinical Impression(s) / ED Diagnoses Final diagnoses:  Flank pain    Rx / DC Orders ED Discharge Orders         Ordered    cephALEXin (KEFLEX) 500 MG capsule  3 times daily        03/21/20 1835           Cheryll Cockayne, MD 03/21/20 1836

## 2020-03-21 NOTE — ED Notes (Signed)
Pt called for vitals 3x no response 

## 2020-04-20 ENCOUNTER — Other Ambulatory Visit: Payer: Self-pay | Admitting: Urology

## 2020-04-20 ENCOUNTER — Other Ambulatory Visit (HOSPITAL_COMMUNITY): Payer: Self-pay | Admitting: Urology

## 2020-04-20 DIAGNOSIS — N130 Hydronephrosis with ureteropelvic junction obstruction: Secondary | ICD-10-CM

## 2020-04-20 DIAGNOSIS — R3121 Asymptomatic microscopic hematuria: Secondary | ICD-10-CM

## 2020-04-27 ENCOUNTER — Encounter (HOSPITAL_COMMUNITY): Payer: Self-pay

## 2020-04-27 ENCOUNTER — Ambulatory Visit (HOSPITAL_COMMUNITY): Admission: RE | Admit: 2020-04-27 | Payer: Self-pay | Source: Ambulatory Visit

## 2021-06-23 ENCOUNTER — Emergency Department (HOSPITAL_COMMUNITY): Admission: EM | Admit: 2021-06-23 | Discharge: 2021-06-23 | Discharge status: Against Medical Advice | Payer: Self-pay

## 2021-06-23 NOTE — ED Notes (Signed)
Pt called x3 for triage, no response.  

## 2021-06-23 NOTE — ED Notes (Signed)
Called pt again x4 for triage, still no response. Moving pt OTF.  ?

## 2021-12-14 IMAGING — CT CT RENAL STONE PROTOCOL
2 of 4 series · 16 of 46 positions shown, 18 images · non-contrast
Comparison: None.

CLINICAL DATA: Left lower quadrant pain

EXAM:
CT ABDOMEN AND PELVIS WITHOUT CONTRAST
TECHNIQUE: Multidetector CT imaging of the abdomen and pelvis was performed
following the standard protocol without IV contrast.

[Series 4: ap without · axial · non-contrast · 0.60mm/px · z∈[+840,+1300]mm · 13 of 104 slices shown, 15 images]
[im 6/104  soft-tissue]
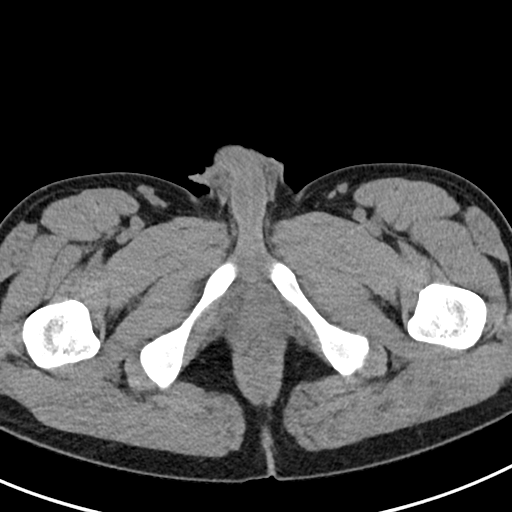
[im 6/104  bone]
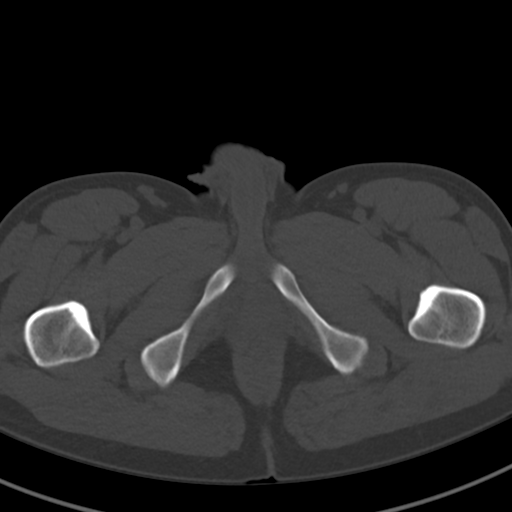
[im 17/104  soft-tissue]
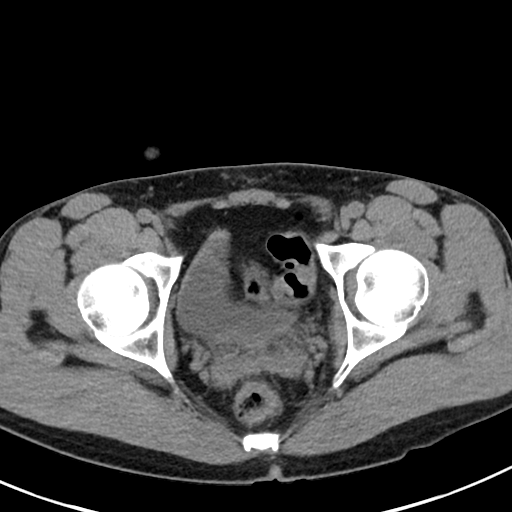
[im 22/104  soft-tissue]
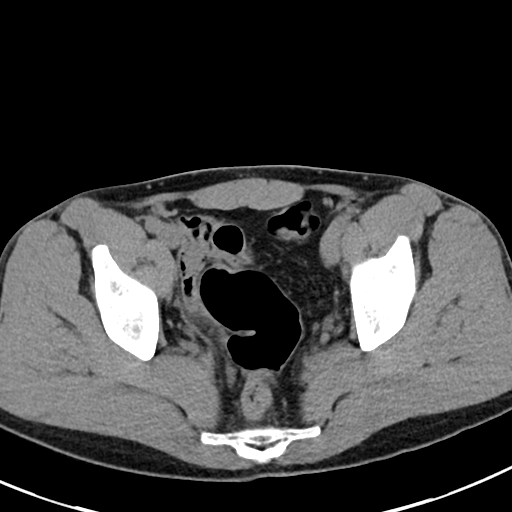
[im 28/104  soft-tissue]
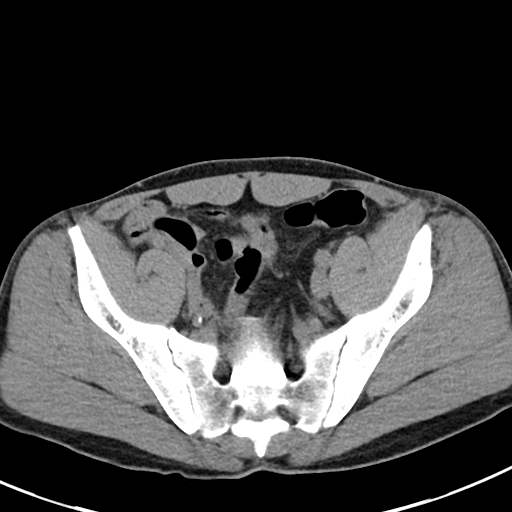
[im 38/104  soft-tissue]
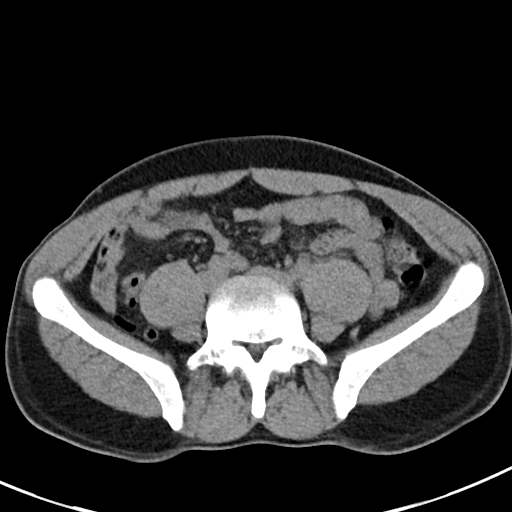
[im 44/104  soft-tissue]
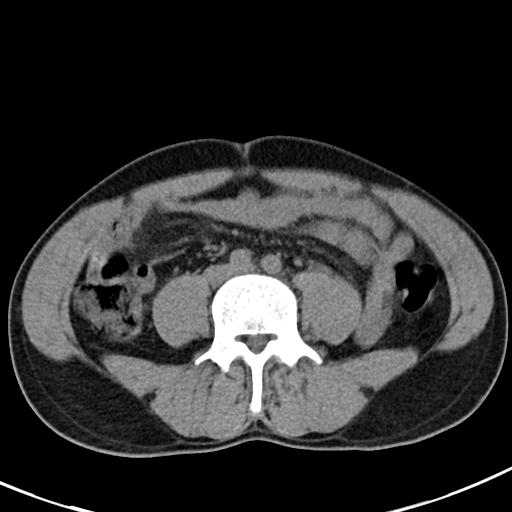
[im 55/104  soft-tissue]
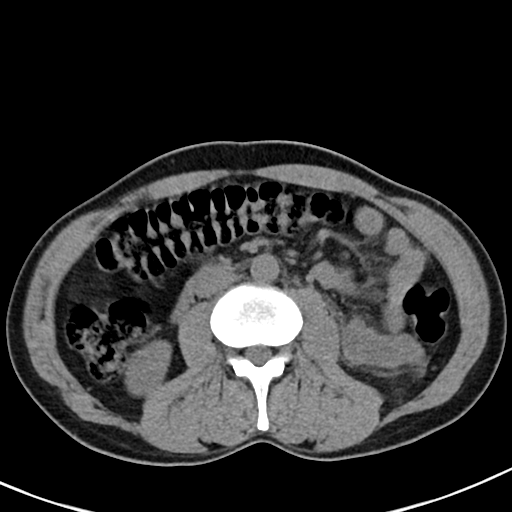
[im 60/104  soft-tissue]
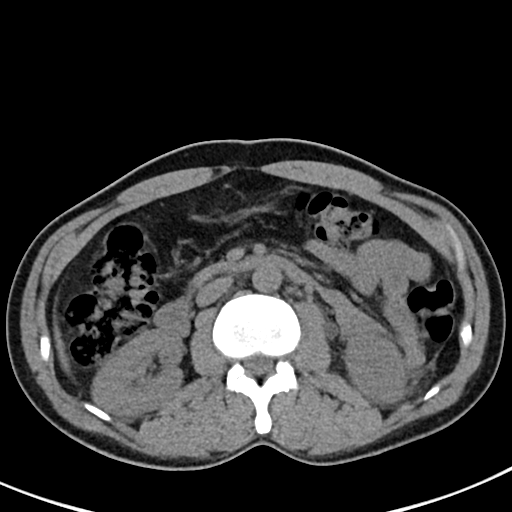
[im 66/104  soft-tissue]
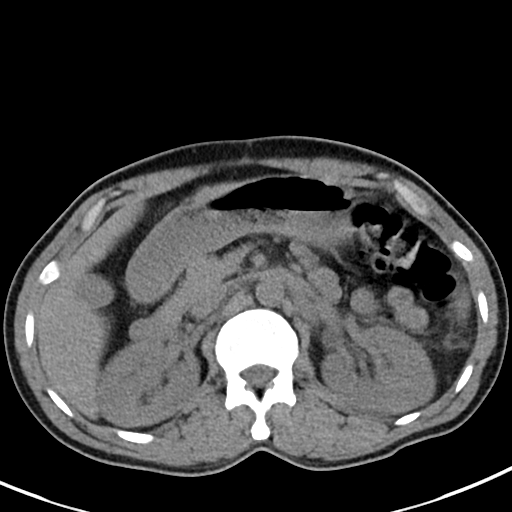
[im 66/104  bone]
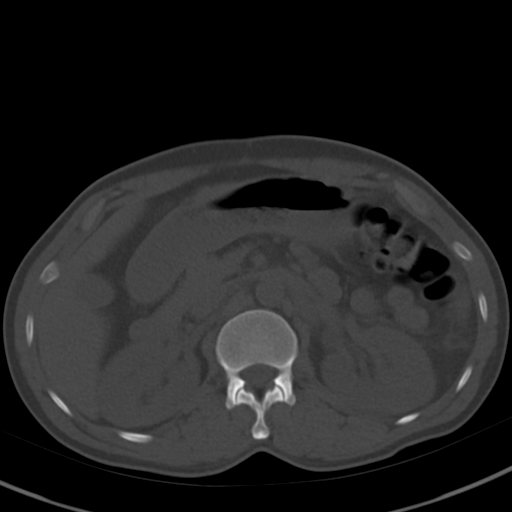
[im 76/104  soft-tissue]
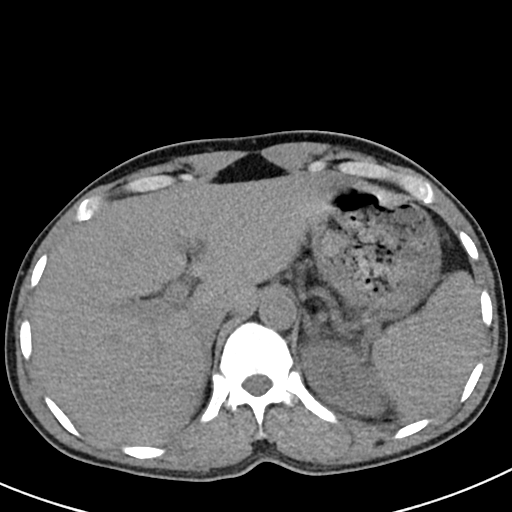
[im 82/104  soft-tissue]
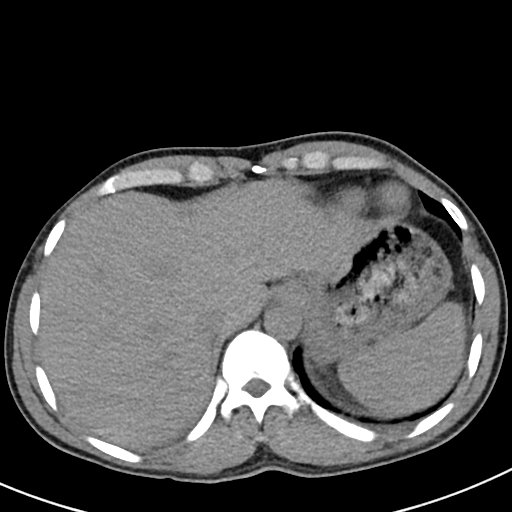
[im 87/104  soft-tissue]
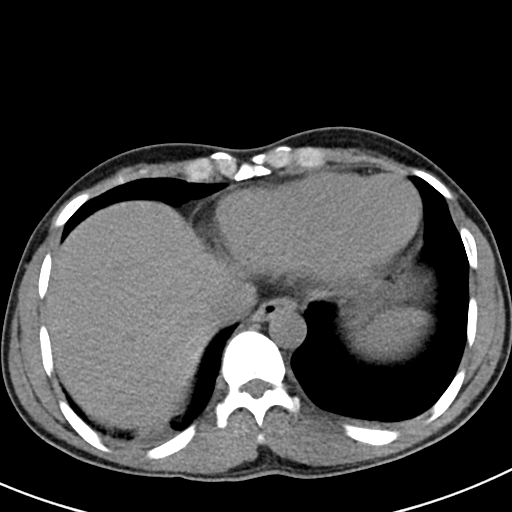
[im 98/104  soft-tissue]
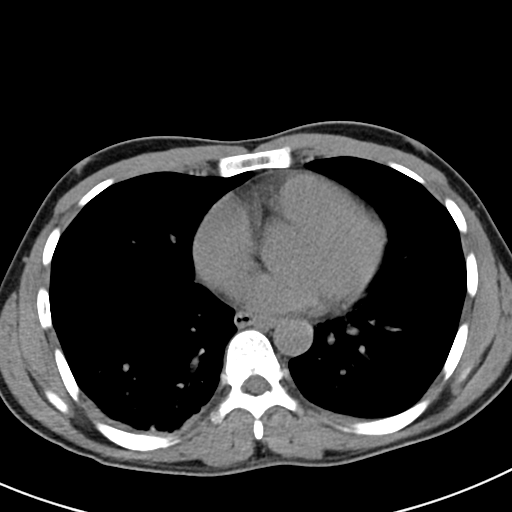

[Series 7: cor · coronal · 0.73mm/px · 3 of 72 slices shown]
[im 24/72  soft-tissue]
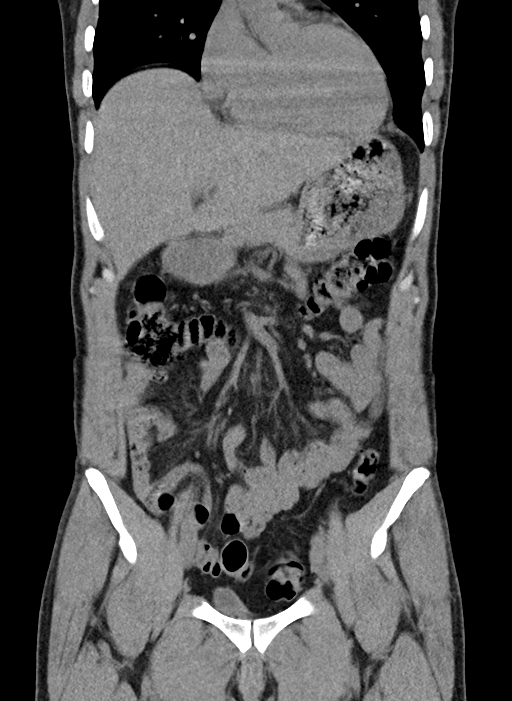
[im 32/72  soft-tissue]
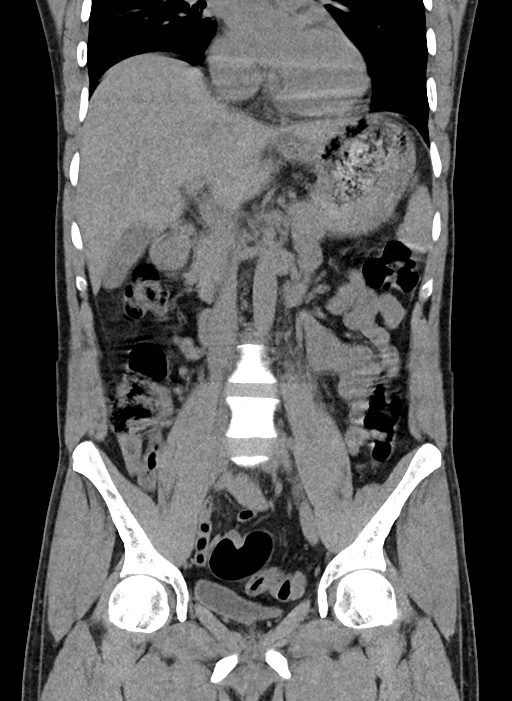
[im 40/72  soft-tissue]
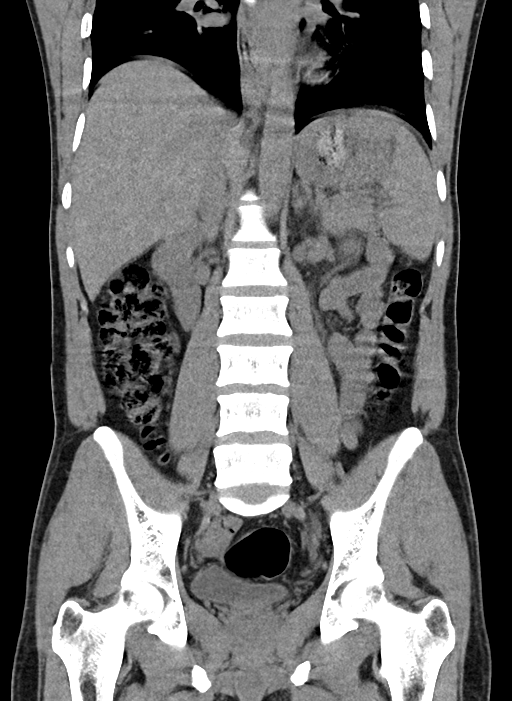

[16 of 46 positions shown; findings below may reference images not displayed]

FINDINGS: Lower chest: 4 mm right lower lobe pulmonary nodule on image 6.
Right basilar atelectasis. No effusions.

Hepatobiliary: No focal hepatic abnormality. Gallbladder
unremarkable.

Pancreas: No focal abnormality or ductal dilatation.

Spleen: No focal abnormality.  Normal size.

Adrenals/Urinary Tract: No renal or ureteral stones. Slight fullness
of the left renal pelvis and proximal ureter. Slight perinephric
stranding around the left kidney. This could be related to recently
passed stone. Adrenal glands and urinary bladder unremarkable.

Stomach/Bowel: Normal appendix. Stomach, large and small bowel
grossly unremarkable.

Vascular/Lymphatic: No evidence of aneurysm or adenopathy.

Reproductive: No visible focal abnormality.

Other: No free fluid or free air.

Musculoskeletal: No acute bony abnormality.
IMPRESSION: Slight fullness of the left renal collecting system and proximal
ureter with slight left perinephric stranding. No visible stones.
This could be related to recently passed stone.
